# Patient Record
Sex: Female | Born: 1970 | Race: White | Hispanic: No | Smoking: Current every day smoker
Health system: Southern US, Community
[De-identification: ages and names within clinical notes are randomized; demographics above are authoritative.]

## PROBLEM LIST (undated history)

## (undated) DIAGNOSIS — I10 Essential (primary) hypertension: Secondary | ICD-10-CM

## (undated) DIAGNOSIS — E785 Hyperlipidemia, unspecified: Secondary | ICD-10-CM

## (undated) HISTORY — PX: TUBAL LIGATION: SHX77

## (undated) HISTORY — PX: APPENDECTOMY: SHX54

---

## 2018-03-19 ENCOUNTER — Emergency Department (HOSPITAL_BASED_OUTPATIENT_CLINIC_OR_DEPARTMENT_OTHER)
Admission: EM | Admit: 2018-03-19 | Discharge: 2018-03-19 | Disposition: A | Discharge status: Home / Self Care | Payer: No Typology Code available for payment source | Attending: Emergency Medicine | Admitting: Emergency Medicine

## 2018-03-19 ENCOUNTER — Emergency Department (HOSPITAL_BASED_OUTPATIENT_CLINIC_OR_DEPARTMENT_OTHER): Payer: No Typology Code available for payment source

## 2018-03-19 ENCOUNTER — Encounter (HOSPITAL_BASED_OUTPATIENT_CLINIC_OR_DEPARTMENT_OTHER): Payer: Self-pay | Admitting: *Deleted

## 2018-03-19 ENCOUNTER — Other Ambulatory Visit: Payer: Self-pay

## 2018-03-19 DIAGNOSIS — S93401A Sprain of unspecified ligament of right ankle, initial encounter: Secondary | ICD-10-CM

## 2018-03-19 DIAGNOSIS — Y9301 Activity, walking, marching and hiking: Secondary | ICD-10-CM | POA: Insufficient documentation

## 2018-03-19 DIAGNOSIS — Y999 Unspecified external cause status: Secondary | ICD-10-CM | POA: Diagnosis not present

## 2018-03-19 DIAGNOSIS — I10 Essential (primary) hypertension: Secondary | ICD-10-CM | POA: Diagnosis not present

## 2018-03-19 DIAGNOSIS — W0110XA Fall on same level from slipping, tripping and stumbling with subsequent striking against unspecified object, initial encounter: Secondary | ICD-10-CM | POA: Diagnosis not present

## 2018-03-19 DIAGNOSIS — S8001XA Contusion of right knee, initial encounter: Secondary | ICD-10-CM | POA: Diagnosis not present

## 2018-03-19 DIAGNOSIS — F17200 Nicotine dependence, unspecified, uncomplicated: Secondary | ICD-10-CM | POA: Diagnosis not present

## 2018-03-19 DIAGNOSIS — Y929 Unspecified place or not applicable: Secondary | ICD-10-CM | POA: Diagnosis not present

## 2018-03-19 DIAGNOSIS — S99911A Unspecified injury of right ankle, initial encounter: Secondary | ICD-10-CM | POA: Diagnosis present

## 2018-03-19 HISTORY — DX: Essential (primary) hypertension: I10

## 2018-03-19 HISTORY — DX: Hyperlipidemia, unspecified: E78.5

## 2018-03-19 MED ORDER — IBUPROFEN 600 MG PO TABS: 600.0000 mg | ORAL_TABLET | Freq: Four times a day (QID) | ORAL | 0 refills | Status: AC | PRN

## 2018-03-19 MED ORDER — KETOROLAC TROMETHAMINE 60 MG/2ML IM SOLN
60.0000 mg | Freq: Once | INTRAMUSCULAR | Status: AC
  Administered 2018-03-19: 60 mg via INTRAMUSCULAR
  Filled 2018-03-19: qty 2

## 2018-03-19 NOTE — ED Triage Notes (Addendum)
Fall. She slipped in water and fell while working today.  Injury to her right knee and ankle. Supervisior was notified. UDS is scheduled on Monday per pt.

## 2018-03-19 NOTE — ED Notes (Signed)
PMS intact before and after. Pt tolerated well. All questions answered. 

## 2018-03-19 NOTE — ED Provider Notes (Signed)
[facility] EMERGENCY DEPARTMENT Provider Note   CSN: [id number] Arrival date & time: 03/19/18  1910     History   Chief Complaint Chief Complaint  Patient presents with  . Fall    HPI Holly Gill is a 47 y.o. female.  HPI Patient states he stepped in some water, slipped, twisting her right ankle and then landing on her right knee.  Denies hitting her head.  She now complains of right ankle pain and right knee pain.  She is wearing a brace to her right ankle because she states she has "weak ankles".  She has chronic sensory changes to her foot and this is unchanged.  No new focal weakness.    Past Medical History:  Diagnosis Date  . Hyperlipidemia   . Hypertension     There are no active problems to display for this patient.   Past Surgical History:  Procedure Laterality Date  . APPENDECTOMY    . CESAREAN SECTION    . TUBAL LIGATION       OB History   None      Home Medications    Prior to Admission medications   Medication Sig Start Date End Date Taking? Authorizing Provider  ibuprofen (ADVIL,MOTRIN) 600 MG tablet Take 1 tablet (600 mg total) by mouth every 6 (six) hours as needed. 03/19/18   Loren Racer, MD    Family History No family history on file.  Social History Social History   Tobacco Use  . Smoking status: Current Every Day Smoker  . Smokeless tobacco: Never Used  Substance Use Topics  . Alcohol use: Not Currently    Frequency: Never  . Drug use: Never     Allergies   Patient has no known allergies.   Review of Systems Review of Systems  Constitutional: Negative for chills and fever.  Respiratory: Negative for shortness of breath.   Cardiovascular: Negative for chest pain.  Gastrointestinal: Negative for abdominal pain and vomiting.  Musculoskeletal: Positive for arthralgias and joint swelling. Negative for myalgias and neck pain.  Skin: Negative for rash and wound.  Neurological: Positive for numbness.  Negative for weakness and headaches.  All other systems reviewed and are negative.    Physical Exam Updated Vital Signs BP (!) 162/86   Pulse 72   Temp 98.5 F (36.9 C) (Oral)   Resp 16   Ht 5\' 6"  (1.676 m)   Wt 95.3 kg (210 lb)   LMP 03/14/2018   SpO2 95%   BMI 33.89 kg/m   Physical Exam  Constitutional: She is oriented to person, place, and time. She appears well-developed and well-nourished.  HENT:  Head: Normocephalic and atraumatic.  Mouth/Throat: Oropharynx is clear and moist.  Eyes: Pupils are equal, round, and reactive to light. EOM are normal.  Neck: Normal range of motion. Neck supple.  Cardiovascular: Normal rate and regular rhythm.  Pulmonary/Chest: Effort normal and breath sounds normal.  Abdominal: Soft. Bowel sounds are normal. There is no tenderness. There is no rebound and no guarding.  Musculoskeletal: Normal range of motion. She exhibits edema and tenderness.  Patient has mild right ankle edema.  Most tender over the medial malleolus.  Mild tenderness over the lateral malleolus and just distal.  No obvious deformity.  2+ dorsalis pedis and posterior tibial pulses.  Patient has mild contusion to the anterior knee.  Tenderness to palpation over the patella.  No obvious joint effusion.  Full range of motion.  No ligamentous instability.  Neurological: She is  alert and oriented to person, place, and time.  5/5 motor in all extremities.  Sensation grossly intact.  Skin: Skin is warm and dry. No rash noted. No erythema.  Psychiatric: She has a normal mood and affect. Her behavior is normal.  Nursing note and vitals reviewed.    ED Treatments / Results  Labs (all labs ordered are listed, but only abnormal results are displayed) Labs Reviewed - No data to display  EKG None  Radiology Dg Ankle Complete Right  Result Date: 03/19/2018 CLINICAL DATA:  Slipped and fell today with medial ankle pain. EXAM: RIGHT ANKLE - COMPLETE 3+ VIEW COMPARISON:  None.  FINDINGS: No acute fracture or dislocation is seen. 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 IMPRESSION: No acute fracture identified. Pronounced subtalar degenerative changes with osteophytes and loose bodies. Midfoot degenerative changes also present. Electronically Signed   By: Paulina FusiMark  Shogry M.D.   On: 03/19/2018 19:47   Dg Knee Complete 4 Views Right  Result Date: 03/19/2018 CLINICAL DATA:  Slipped and fell today.  Pain. EXAM: RIGHT KNEE - COMPLETE 4+ VIEW COMPARISON:  None FINDINGS: Minimal medial compartment and patellofemoral compartment osteophytes. No joint space narrowing. No evidence of fracture, dislocation or joint effusion. IMPRESSION: No acute or traumatic finding. Minimal medial compartment and patellofemoral compartment osteophytes. Electronically Signed   By: Paulina FusiMark  Shogry M.D.   On: 03/19/2018 19:48    Procedures Procedures (including critical care time)  Medications Ordered in ED Medications  ketorolac (TORADOL) injection 60 mg (has no administration in time range)     Initial Impression / Assessment and Plan / ED Course  I have reviewed the triage vital signs and the nursing notes.  Pertinent labs & imaging results that were available during my care of the patient were reviewed by me and considered in my medical decision making (see chart for details).    Given crutches.  Patient already has a ankle brace.  Advised rice treatment and follow-up with orthopedist is symptoms are not improving.  Return precautions given.   Final Clinical Impressions(s) / ED Diagnoses   Final diagnoses:  Sprain of right ankle, unspecified ligament, initial encounter  Contusion of right knee, initial encounter    ED Discharge Orders        Ordered    ibuprofen (ADVIL,MOTRIN) 600 MG tablet  Every 6 hours PRN     03/19/18 Creola [id number]       Caterina Racine, MD 03/19/18 2005

## 2019-12-27 IMAGING — DX DG ANKLE COMPLETE 3+V*R*
3 series · 3 of 3 positions shown · non-contrast
Comparison: None.

CLINICAL DATA: Slipped and fell today with medial ankle pain.

EXAM:
RIGHT ANKLE - COMPLETE 3+ VIEW

[ankle ap]
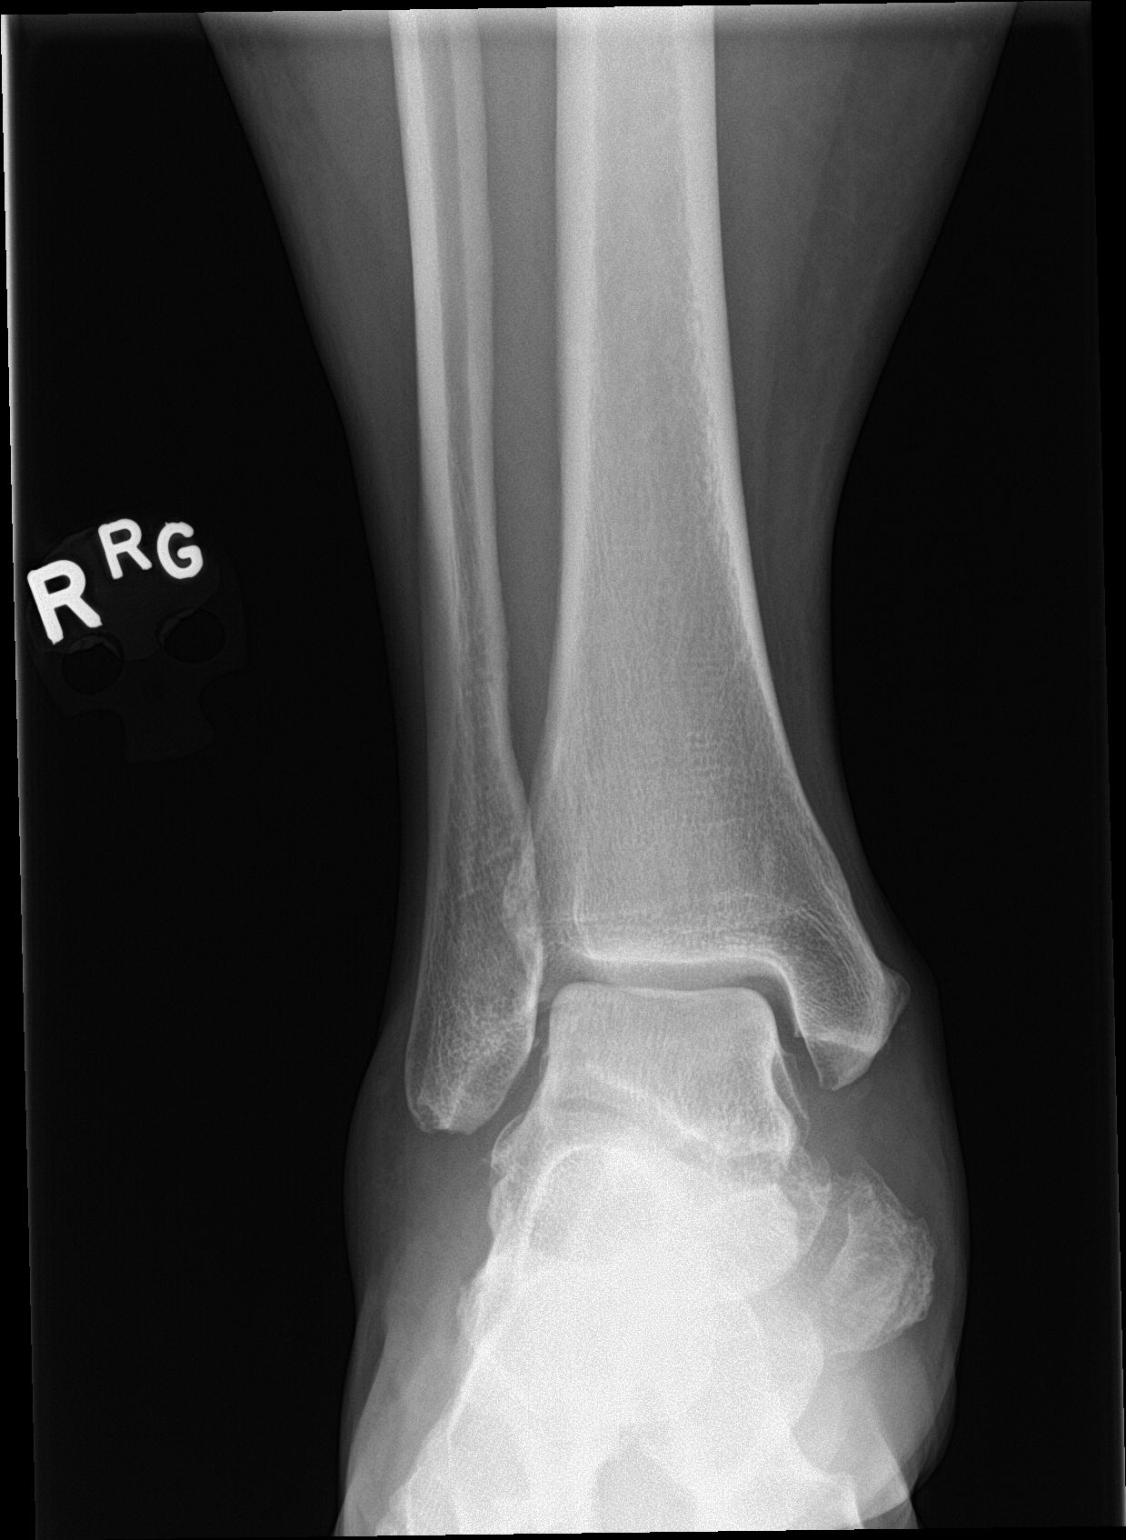

[ankle obl]
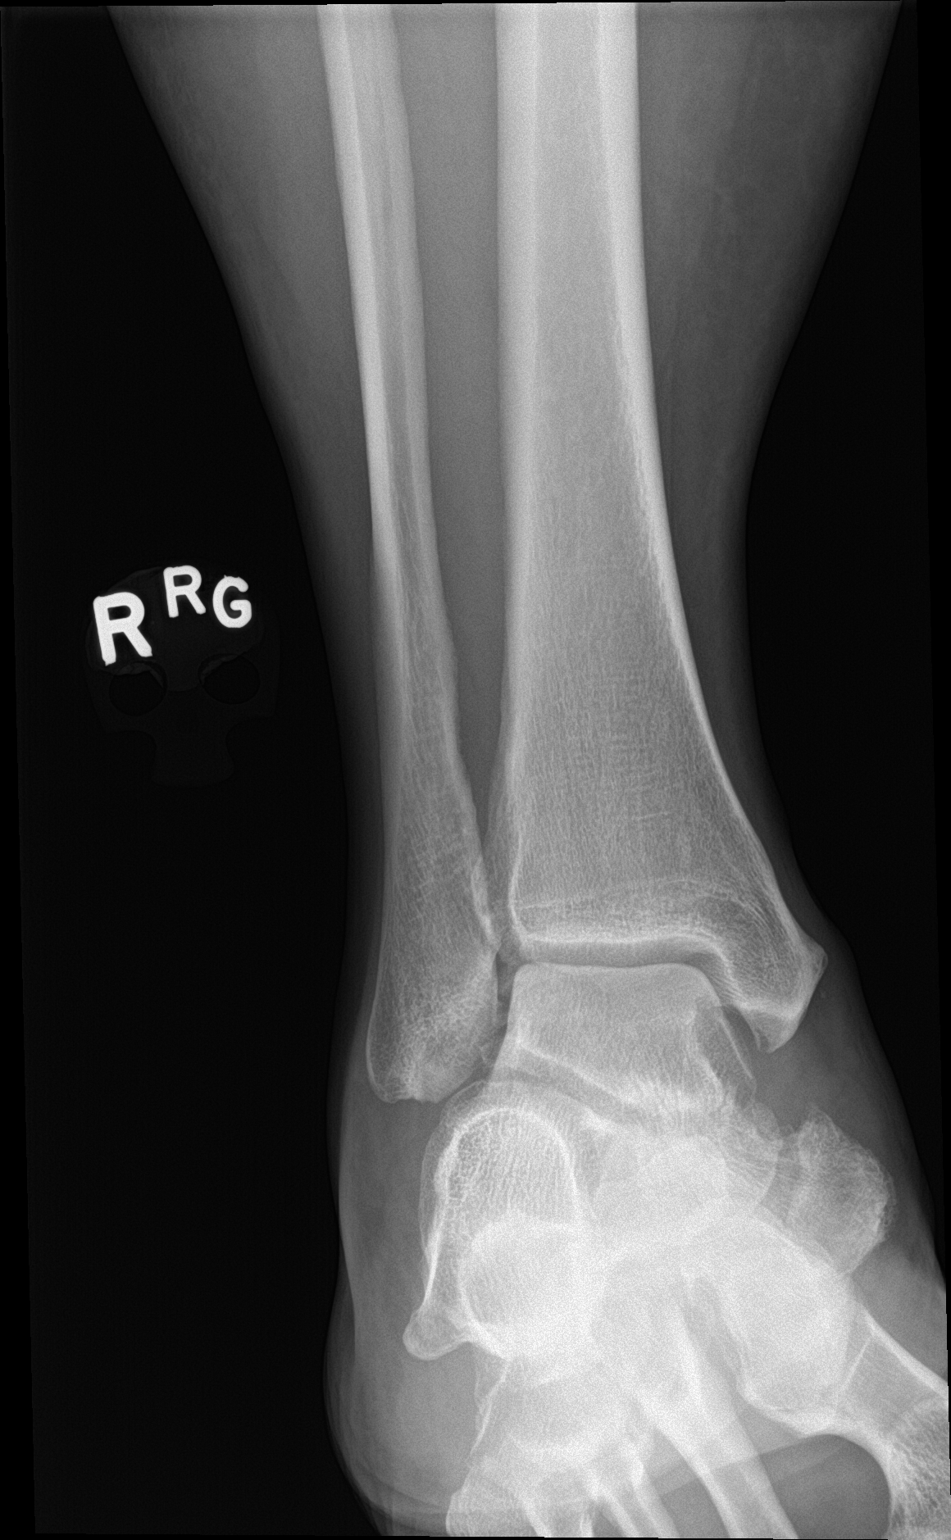

[ankle lat]
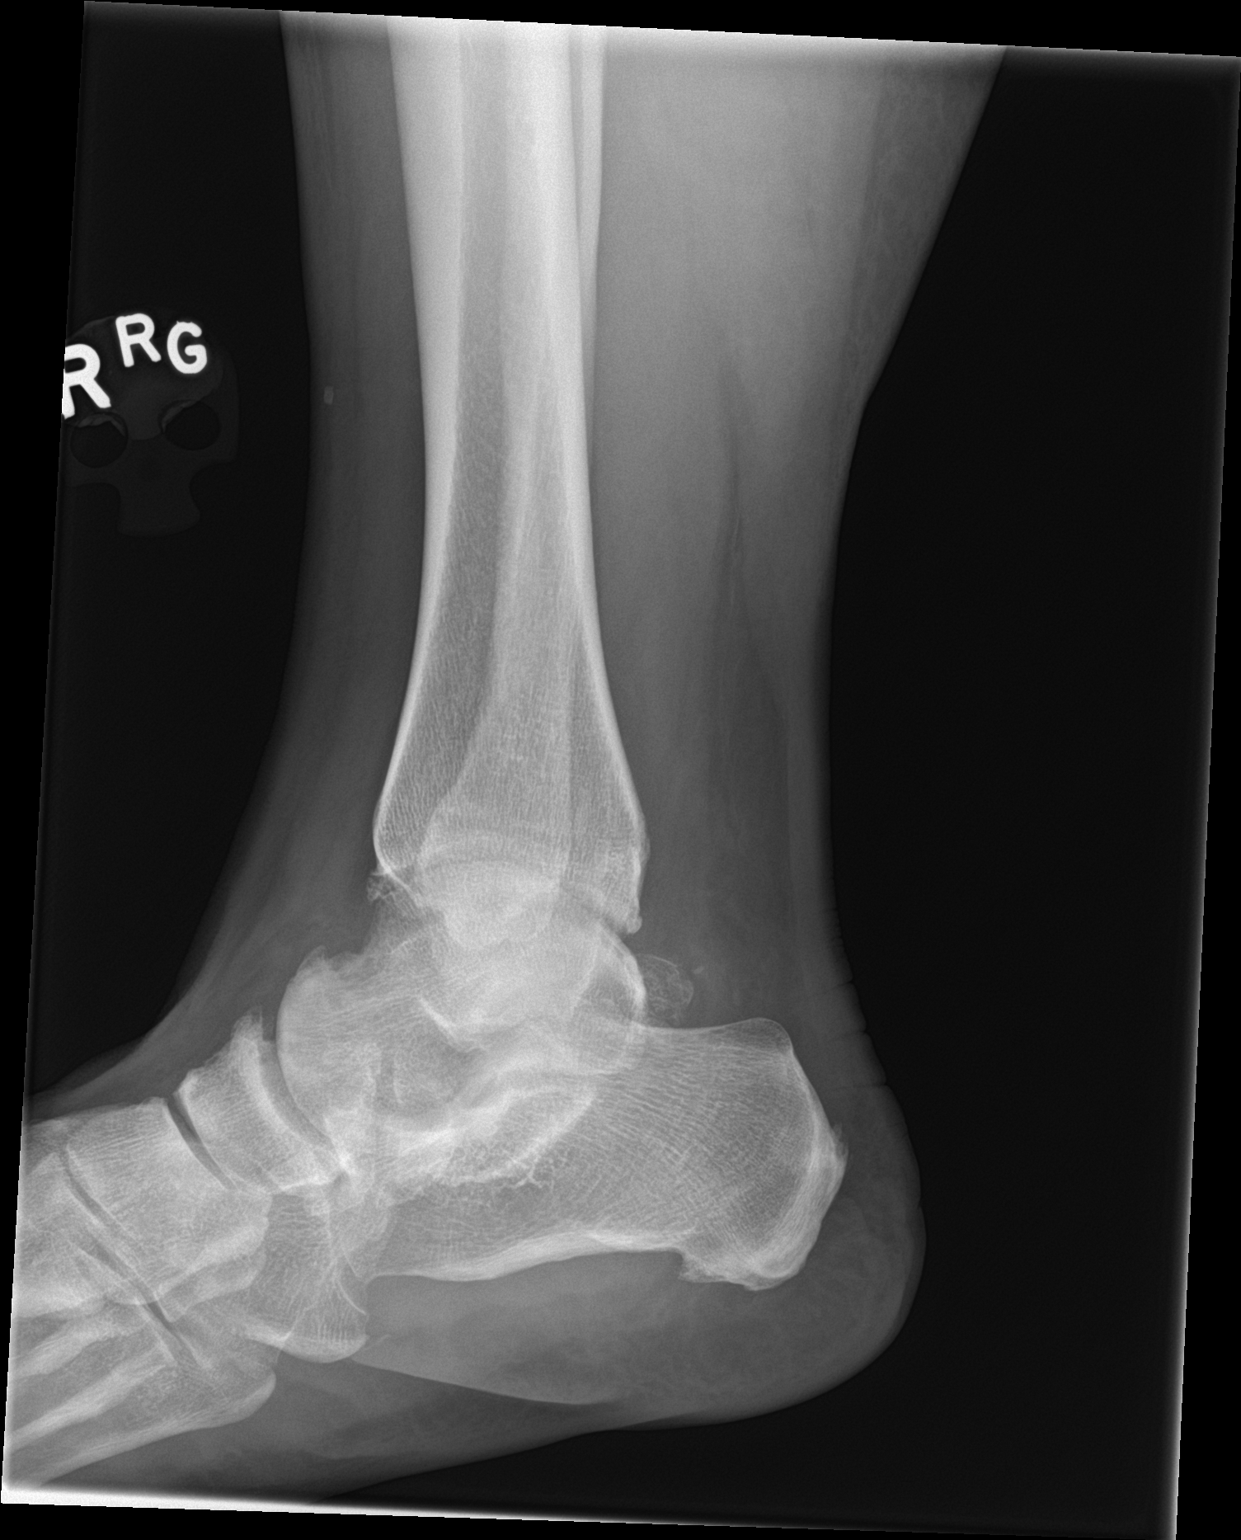

[3 of 3 positions shown; findings below may reference images not displayed]

FINDINGS: No acute fracture or dislocation is seen. Patient has advanced
chronic degenerative changes of the subtalar joint with osteophytes
and loose bodies. There is also midfoot osteoarthritis.
IMPRESSION: No acute fracture identified. Pronounced subtalar degenerative
changes with osteophytes and loose bodies. Midfoot degenerative
changes also present.
